# Patient Record
Sex: Male | Born: 1965 | Race: White | Hispanic: No
Health system: Southern US, Community
[De-identification: ages and names within clinical notes are randomized; demographics above are authoritative.]

---

## 2006-02-02 ENCOUNTER — Ambulatory Visit (HOSPITAL_COMMUNITY): Admission: RE | Admit: 2006-02-02 | Discharge: 2006-02-02 | Payer: Self-pay | Admitting: Family Medicine

## 2006-03-07 ENCOUNTER — Encounter: Admission: RE | Admit: 2006-03-07 | Discharge: 2006-03-07 | Payer: Self-pay | Admitting: Orthopaedic Surgery

## 2006-03-13 ENCOUNTER — Encounter: Admission: RE | Admit: 2006-03-13 | Discharge: 2006-03-13 | Payer: Self-pay | Admitting: Orthopaedic Surgery

## 2006-03-15 ENCOUNTER — Inpatient Hospital Stay (HOSPITAL_COMMUNITY): Admission: RE | Admit: 2006-03-15 | Discharge: 2006-03-19 | Payer: Self-pay | Admitting: Orthopaedic Surgery

## 2006-03-27 ENCOUNTER — Encounter: Payer: Self-pay | Admitting: Vascular Surgery

## 2006-03-27 ENCOUNTER — Ambulatory Visit (HOSPITAL_COMMUNITY): Admission: RE | Admit: 2006-03-27 | Discharge: 2006-03-27 | Payer: Self-pay | Admitting: Orthopaedic Surgery

## 2006-07-31 ENCOUNTER — Encounter: Admission: RE | Admit: 2006-07-31 | Discharge: 2006-07-31 | Payer: Self-pay | Admitting: Orthopaedic Surgery

## 2006-09-05 ENCOUNTER — Ambulatory Visit (HOSPITAL_COMMUNITY): Admission: RE | Admit: 2006-09-05 | Discharge: 2006-09-05 | Payer: Self-pay | Admitting: Neurosurgery

## 2007-04-16 ENCOUNTER — Ambulatory Visit (HOSPITAL_COMMUNITY): Admission: RE | Admit: 2007-04-16 | Discharge: 2007-04-16 | Payer: Self-pay | Admitting: Family Medicine

## 2007-12-18 ENCOUNTER — Ambulatory Visit (HOSPITAL_COMMUNITY): Admission: RE | Admit: 2007-12-18 | Discharge: 2007-12-18 | Payer: Self-pay | Admitting: Family Medicine

## 2008-09-10 ENCOUNTER — Ambulatory Visit (HOSPITAL_COMMUNITY): Admission: RE | Admit: 2008-09-10 | Discharge: 2008-09-10 | Payer: Self-pay | Admitting: Anesthesiology

## 2010-07-17 ENCOUNTER — Encounter: Payer: Self-pay | Admitting: Family Medicine

## 2010-11-11 NOTE — Op Note (Signed)
[name], VEGH NO.:  [id number]   MEDICAL RECORD NO.:  [id number]          PATIENT TYPE:  OIB   LOCATION:  5021                         FACILITY:  [facility]   PHYSICIAN:  Sharolyn Douglas, M.D.        DATE OF BIRTH:  1965/07/10   DATE OF PROCEDURE:  03/14/2006  DATE OF DISCHARGE:                                 OPERATIVE REPORT   DIAGNOSES:  1. 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  2. L4-5 degenerative disk disease with posterior annular tear and      retrolisthesis.   PROCEDURES:  1. L4-5 and L5-S1 hemilaminectomy with wide decompression of the left L5      and S1 nerve roots.  2. Transforaminal lumbar interbody fusion, L4-5 and L5-S1, with placement      of two PEEK cages packed with bone morphogenic protein.  3. Segmental pedicle screw instrumentation L4 through S1 using the Abbott      Spine system.  4. Posterior spinal arthrodesis, L4 through S1.  5. Local autogenous bone graft supplemented with 15 cc of Grafton      allograft and bone morphogenic protein.   SURGEON:  Sharolyn Douglas, M.D.   ASSISTANT:  Verlin Fester, P.A.   ANESTHESIA:  General endotracheal.   ESTIMATED BLOOD LOSS:  400 mL, 200 returned from Cell Saver.   COMPLICATIONS:  None.   Needle and sponge count correct.   INDICATIONS:  The patient is a 45 year old pleasant male with severe,  intractable left lower extremity pain and weakness.  He has an L5-S1 isthmic  spondylolisthesis with an underlying foraminal disk herniation and severe  left L5 nerve root impingement with secondary radiculopathy.  There is a  retrolisthesis of L4 and L5 with associated posterior annular tear and disk  bulging with lateral recess narrowing.  He has failed to respond to a  selected nerve root block and facet injection.  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  He is taking escalating doses of narcotics and  neuroleptics without relief.  At this  point, he elects to undergo two-level  lumbar decompression and fusion from L4 to S1 in hopes of improving his  symptoms.  Risks and benefits were reviewed.   PROCEDURE:  The patient was identified in the holding area.  After informed  consent, he was taken to the operating room.  He was given prophylactic IV  antibiotics.  Neuromonitoring was established in the form of SSEPs and lower  extremity EMGs.  He underwent general endotracheal anesthesia and was turned  prone onto the Wilson frame.  All bony prominences padded, face and eyes  protected at all times, back prepped and draped in the usual sterile  fashion.  A midline incision was made from L4 down to S1.  Dissection was  carried sharply through the deep fascia.  Subperiosteal exposure was carried  out to the tips of the transverse processes of L4, L5, and the sacral ala  bilaterally.  Intraoperative x-ray was taken to confirm the levels.  The  L5  spinous process was easily identifiable secondary to the pars fractures.  The protractors were placed.   We then turned our attention to performing a wide laminectomy at L5-S1 by  removing the entire Gill fragment.  Further decompression was then carried  out laterally on the left side.  We found a large osteophyte and fibrous  material projecting through the pars defect directly into the L5 nerve root.  This also appeared to be impinging somewhat on the S1 nerve root.  All of  this material was decompressed radically of the foramen.  We found that the  L5-S1 foramen was stenotic not only from the osteophytes but also due to the  L5 pedicle compressing the nerve root against the back of the sacrum.  A  wide foraminotomy was completed.  The S1 nerve root was decompressed down  towards the S1 pedicle.  We then carried the decompression up proximally,  completing a hemilaminectomy of L4-5.  This allowed identification of the  takeoff of the L5 nerve root from the thecal sac.  The L4-5 facet  joint was  overgrown and there was a bulging disk which was narrowing the lateral  recess.  Diskectomy was completed at this level, allowing full decompressing  of the nerve root.  At this point we felt that both the L5 and S1 nerve  roots were thoroughly decompressed except within the L5-S1 foramen due to  the up-down stenosis and this would be dealt with, with a transforaminal  lumbar interbody fusion procedure for indirect distraction of the foramen  and further decompression of the nerve root.   We then turned our attention to placing pedicle screws at L4, L5, and S1  bilaterally.  Using an anatomic probing technique, each pedicle starting  point was identified and initiated with the awl.  The pedicle probe was then  used to cannulate the pedicles.  Each hole was palpated with the ball-tip  feeler and there were no breaches.  The pedicles were tapped.  We placed 6.5  x 55 mm screws at L4 bilaterally, 6.5 x 50 mm screws at L5 bilaterally, and  7.5 x 45 mm screws bilaterally in the sacrum.  The screw purchase was good.  We stimulated each pedicle screw using trigger EMGs and there were no  deleterious changes.   We then turned our attention to completing transforaminal lumbar interbody  fusion procedures at both L4-5 and L5-S1.  The remaining facet joints were  osteotomized, creating a transforaminal window.  Throughout the procedure,  free-running EMGs were monitored.  The exiting and transversing nerve roots  were identified at each level.  Starting at L5-S1, the disk space was  entered.  The overhanging osteophytes on the sacrum were removed using the  Kerrison punch.  Various shavers and interbody distractors were used to  prepare the disk space along with curved curettes.  A radical diskectomy was  completed.  The cartilaginous endplates were scraped across to the  contralateral side.  Disk space was dilated up to 11 mm.  The disk space was packed with BMP sponges and local bone  graft obtained from laminectomy.  We  then inserted the 11-mm PEEK cage which had been packed with a BMP sponge  into the interspace, tamped it anteriorly and across the midline.  We had  good distraction.  We now re-evaluated the L5-S1 foramen and the nerve root  was completely free.  We then performed a similar procedure at L4-5.  This  time, a  13-mm PEEK cage was used.  Again, the disk space was packed with the  BMP sponges along with local bone graft.  The 13-mm PEEK cage was inserted  into the interspace and again tamped anteriorly and across the midline.  We  had excellent distraction at this level as well.  Hemostasis was achieved.   We then completed the posterior spinal fusion by decorticating the  transverse process of L4-L5 and the sacral ala bilaterally and tightly  packing the lateral gutters with the remaining local bone graft which was  supplemented with 15 cc of Grafton allograft.  Then 70-mm titanium rods were  placed into the polyaxial screw heads and gentle compression was applied  across each segment before shearing off the locking caps.  A cross connector  was placed.   The wound was irrigated.  Gelfoam was left over the exposed epidural space.  Intraoperative x-ray showed appropriate positioning of the instrumentation  with complete reduction of the spondylolisthesis and good distraction of the  disk spaces.  A deep Hemovac drain was left in place.  On-Q pain catheters  were placed percutaneously into the paraspinal muscles and bolused with 5 cc  of 0.5% Marcaine.  The deep fascia was closed with a running #1 Vicryl  suture, subcutaneous layer closed with 0 Vicryl and 2-0 Vicryl suture  followed by a running 3-0 subcuticular Vicryl suture on the skin edges.  Benzoin Steri-Strips were placed.  Sterile dressing was applied.  Patient  was turned supine, extubated without difficulty, and transferred to recovery  in stable condition, able to move his upper and lower  extremities.   It should be noted that my assistant, [employer], P.A. was present  throughout the procedure including during the positioning, the exposure, the  decompression, the instrumentation, the arthrodesis, and she also assisted  with the wound closure.      Sharolyn Douglas, M.D.  Electronically Signed     MC/MEDQ  D:  03/14/2006  T:  03/15/2006  Job:  [id number]

## 2010-11-11 NOTE — Discharge Summary (Signed)
[name], Jack Williams NO.:  [id number]   MEDICAL RECORD NO.:  [id number]          PATIENT TYPE:  INP   LOCATION:  5021                         FACILITY:  [facility]   PHYSICIAN:  Sharolyn Douglas, M.D.        DATE OF BIRTH:  [birth year]   DATE OF ADMISSION:  03/14/2006  DATE OF DISCHARGE:  03/19/2006                                 DISCHARGE SUMMARY   ADMISSION DIAGNOSES:  1. L4-5 and L5-S1 spondylosis and L5-S1 spondylolisthesis.  2. Reflux.   DISCHARGE DIAGNOSES:  1. Status post L4 to S1 posterior spinal fusion, doing well.  2. Postoperative blood loss anemia.   PROCEDURE:  On March 14, 2006, the patient was taken to the operating  room for an L4 to S1 posterior spinal fusion with pedicle screws, Gill  laminectomy, and TLIF.  Surgeon was Sharolyn Douglas, assistant [employer], and  anesthesia use was general.   CONSULTATIONS:  None.   LABORATORY DATA:  Preoperatively, CBC with diff was within normal limits  with the exception of a white count of 13.1, neutrophils of 81, absolute  neutrophils of 10.6, lymphs of 11, and absolute monos of 1.  Postoperatively, the H and H were monitored x3 days and he had a low of 12.2  and 35.6, on March 15, 2006, and increased up gradually thereafter.  PT/INR and PTT, preop, were normal.  Complete metabolic panel, preop, was  normal with the exception of glucose of 103, AST of 66, ALT of 268.  Basic  metabolic panel x2 days postoperatively.  On postoperative day #1, on  March 15, 2006, CO2 was 33, glucose of 109.  On March 16, 2006,  glucose was 114, otherwise normal.  UA from March 14, 2006 was negative.  Blood typing, from March 14, 2006, was type A positive.  Antibody screen  was negative.   X-rays of the lumbar spine done on March 14, 2006 showed L4 to S1  posterior fusion without complicating features.  CT scan, from March 17, 2006, of the lumbar spine showed expected postoperative changes from L4  to  S1 with pedicle screws well positioned.  No abnormal features.  X-rays from  March 19, 2006 show still in alignment intact hardware from L4 to S1.  No EKG is seen on chart.   BRIEF HISTORY:  The patient is a 45 year old male who has had severe pain in  his back and his left lower extremity for several months.  He has increasing  pain and increased dependence on narcotics.  He has been unable to work.  Due to his failure to improve with conservative treatments and his x-ray and  MRI findings of spondylosis as well as spondylolisthesis, it was thought his  best course of management would be L4 to S1 posterior spinal fusion  procedure and decompression.  Risks and benefits of the procedure were  discussed with the patient by Dr. Noel Gerold and he indicated understanding and  opted to proceed.   HOSPITAL COURSE:  On March 14, 2006, the patient was take to the  operating room for the above-listed procedure.  He tolerated the procedure  well without any intraoperative complications and he was transferred to the  recovery room in stable condition.  Intraoperatively, blood loss was 500 mL  with 200 mL returned via CellSaver.   Postoperatively, routine orthopedic spine protocol was followed and,  initially, he progressed along very well.  On postoperative day #1, he was  doing quite well.  His preoperative left lower extremity pain was resolved  and he was very pleased.  Unfortunately, over that night, the first night,  he did develop increasing pain in his right lower extremity.  He was put on  Toradol and he continued with his pain medications.  Unfortunately, the pain  continued to increase.  Because of his increasing pain in the right lower  extremity, CT scan was ordered of the lumbar spine to confirm good placement  within the pedicles.  As previously dictated, a CT scan did confirm that all  pedicle screws were well positioned within the pedicles and no evidence or  anything to  suggest that they were irritating a nerve on the right.  On  March 19, 2006, we began giving the patient different pain medications,  oxycodone immediate release 15 mg as well as his Percocet for pain.  His  pain control did improve significantly.  By the end of that day, the patient  had called [place] at the office to request to be discharged.  His pain control  was adequate.  He was medically stable and ready for discharge, both  orthopedically and medically.   PLAN:  The patient is a 45 year old male status post L4 to S1 posterior  spinal fusion.   ACTIVITY:  Daily ambulation program.  Brace on when he is up.  No lifting  over 5 pounds.  Back precautions at all times.  Dressing changes daily.  Keep incision dry until drainage stops.   FOLLOWUP:  2 weeks postoperatively with Dr. Noel Gerold.   DIET:  Regular home diet as tolerated.   MEDICAL DECISION MAKING:  Oxycodone 15 mg immediate release as needed for  pain and Percocet as needed for breakthrough pain.  Robaxin for muscle  spasm.  Multivitamin daily.  Calcium daily.  Colace twice daily as needed.  Laxative as needed.   CONDITION ON DISCHARGE:  Stable, improved.   DISPOSITION:  The patient will be discharged to his home with home-health  physical therapy, occupational therapy, and his family's assistance.      Verlin Fester, P.A.      Sharolyn Douglas, M.D.  Electronically Signed    CM/MEDQ  D:  05/02/2006  T:  05/03/2006  Job:  [id number]

## 2011-10-25 ENCOUNTER — Other Ambulatory Visit: Payer: Self-pay | Admitting: Anesthesiology

## 2011-10-25 DIAGNOSIS — M545 Low back pain, unspecified: Secondary | ICD-10-CM

## 2011-10-27 ENCOUNTER — Ambulatory Visit
Admission: RE | Admit: 2011-10-27 | Discharge: 2011-10-27 | Disposition: A | Discharge status: Home / Self Care | Payer: Self-pay | Source: Ambulatory Visit | Attending: Anesthesiology | Admitting: Anesthesiology

## 2011-10-27 DIAGNOSIS — M545 Low back pain, unspecified: Secondary | ICD-10-CM

## 2013-06-12 ENCOUNTER — Other Ambulatory Visit: Payer: Self-pay | Admitting: Orthopaedic Surgery

## 2013-06-12 DIAGNOSIS — M546 Pain in thoracic spine: Secondary | ICD-10-CM

## 2013-06-13 ENCOUNTER — Ambulatory Visit
Admission: RE | Admit: 2013-06-13 | Discharge: 2013-06-13 | Disposition: A | Discharge status: Home / Self Care | Source: Ambulatory Visit | Attending: Orthopaedic Surgery | Admitting: Orthopaedic Surgery

## 2013-06-13 DIAGNOSIS — M546 Pain in thoracic spine: Secondary | ICD-10-CM

## 2013-06-16 ENCOUNTER — Other Ambulatory Visit: Payer: Self-pay

## 2015-05-05 ENCOUNTER — Ambulatory Visit (HOSPITAL_COMMUNITY): Attending: Orthopaedic Surgery | Admitting: Physical Therapy

## 2015-05-05 DIAGNOSIS — M5441 Lumbago with sciatica, right side: Secondary | ICD-10-CM | POA: Insufficient documentation
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
## 2015-10-11 ENCOUNTER — Encounter (HOSPITAL_COMMUNITY): Payer: Self-pay

## 2015-10-26 DIAGNOSIS — J209 Acute bronchitis, unspecified: Secondary | ICD-10-CM | POA: Diagnosis not present

## 2015-11-03 DIAGNOSIS — H9203 Otalgia, bilateral: Secondary | ICD-10-CM | POA: Diagnosis not present

## 2015-11-03 DIAGNOSIS — Z6822 Body mass index (BMI) 22.0-22.9, adult: Secondary | ICD-10-CM | POA: Diagnosis not present

## 2015-11-03 DIAGNOSIS — J209 Acute bronchitis, unspecified: Secondary | ICD-10-CM | POA: Diagnosis not present

## 2015-11-03 DIAGNOSIS — J040 Acute laryngitis: Secondary | ICD-10-CM | POA: Diagnosis not present

## 2015-11-03 DIAGNOSIS — Z1389 Encounter for screening for other disorder: Secondary | ICD-10-CM | POA: Diagnosis not present

## 2015-11-03 DIAGNOSIS — J329 Chronic sinusitis, unspecified: Secondary | ICD-10-CM | POA: Diagnosis not present

## 2016-12-29 DIAGNOSIS — J329 Chronic sinusitis, unspecified: Secondary | ICD-10-CM | POA: Diagnosis not present

## 2016-12-29 DIAGNOSIS — Z0000 Encounter for general adult medical examination without abnormal findings: Secondary | ICD-10-CM | POA: Diagnosis not present

## 2016-12-29 DIAGNOSIS — Z6822 Body mass index (BMI) 22.0-22.9, adult: Secondary | ICD-10-CM | POA: Diagnosis not present

## 2016-12-29 DIAGNOSIS — Z1389 Encounter for screening for other disorder: Secondary | ICD-10-CM | POA: Diagnosis not present

## 2016-12-29 DIAGNOSIS — J302 Other seasonal allergic rhinitis: Secondary | ICD-10-CM | POA: Diagnosis not present

## 2017-01-12 DIAGNOSIS — Z1211 Encounter for screening for malignant neoplasm of colon: Secondary | ICD-10-CM | POA: Diagnosis not present

## 2017-02-27 DIAGNOSIS — J309 Allergic rhinitis, unspecified: Secondary | ICD-10-CM | POA: Diagnosis not present

## 2017-02-27 DIAGNOSIS — H9313 Tinnitus, bilateral: Secondary | ICD-10-CM | POA: Diagnosis not present

## 2018-05-06 ENCOUNTER — Other Ambulatory Visit: Payer: Self-pay | Admitting: Orthopedic Surgery

## 2018-05-06 DIAGNOSIS — M4716 Other spondylosis with myelopathy, lumbar region: Secondary | ICD-10-CM

## 2018-05-07 ENCOUNTER — Telehealth: Payer: Self-pay | Admitting: Nurse Practitioner

## 2018-05-07 NOTE — Telephone Encounter (Signed)
Phone call to patient to verify medication list and allergies for myelogram procedure. Pt aware he will not need to hold any medications for procedure.  

## 2018-05-15 ENCOUNTER — Other Ambulatory Visit: Payer: Self-pay | Admitting: Orthopedic Surgery

## 2018-05-15 DIAGNOSIS — M4716 Other spondylosis with myelopathy, lumbar region: Secondary | ICD-10-CM

## 2018-05-15 DIAGNOSIS — M4325 Fusion of spine, thoracolumbar region: Secondary | ICD-10-CM

## 2018-05-16 ENCOUNTER — Ambulatory Visit
Admission: RE | Admit: 2018-05-16 | Discharge: 2018-05-16 | Disposition: A | Discharge status: Home / Self Care | Source: Ambulatory Visit | Attending: Orthopedic Surgery | Admitting: Orthopedic Surgery

## 2018-05-16 DIAGNOSIS — M4325 Fusion of spine, thoracolumbar region: Secondary | ICD-10-CM

## 2018-05-16 DIAGNOSIS — M4716 Other spondylosis with myelopathy, lumbar region: Secondary | ICD-10-CM

## 2018-05-16 MED ORDER — DIAZEPAM 5 MG PO TABS
10.0000 mg | ORAL_TABLET | Freq: Once | ORAL | Status: AC
  Administered 2018-05-16: 5 mg via ORAL

## 2018-05-16 MED ORDER — ONDANSETRON HCL 4 MG/2ML IJ SOLN: 4.0000 mg | Freq: Four times a day (QID) | INTRAMUSCULAR | Status: DC | PRN

## 2018-05-16 MED ORDER — IOPAMIDOL (ISOVUE-M 300) INJECTION 61%
10.0000 mL | Freq: Once | INTRAMUSCULAR | Status: AC | PRN
  Administered 2018-05-16: 10 mL via INTRATHECAL

## 2018-05-16 NOTE — Discharge Instructions (Signed)

## 2019-04-16 ENCOUNTER — Other Ambulatory Visit: Payer: Self-pay | Admitting: Orthopaedic Surgery

## 2019-04-16 DIAGNOSIS — M4325 Fusion of spine, thoracolumbar region: Secondary | ICD-10-CM

## 2019-04-16 DIAGNOSIS — M5126 Other intervertebral disc displacement, lumbar region: Secondary | ICD-10-CM

## 2019-04-22 ENCOUNTER — Ambulatory Visit
Admission: RE | Admit: 2019-04-22 | Discharge: 2019-04-22 | Disposition: A | Discharge status: Home / Self Care | Source: Ambulatory Visit | Attending: Orthopaedic Surgery | Admitting: Orthopaedic Surgery

## 2019-04-22 ENCOUNTER — Other Ambulatory Visit: Payer: Self-pay

## 2019-04-22 DIAGNOSIS — M4325 Fusion of spine, thoracolumbar region: Secondary | ICD-10-CM

## 2019-04-22 DIAGNOSIS — M5126 Other intervertebral disc displacement, lumbar region: Secondary | ICD-10-CM

## 2020-10-31 IMAGING — CR DG MYELOGRAPHY LUMBAR INJ MULTI REGION
12 of 24 series · 12 of 24 positions shown · non-contrast
Comparison: Noncontrast thoracic and lumbar spine CT 06/13/2013.
Lumbar spine MRI 08/01/2012.

Addendum:
CLINICAL DATA: Low back pain radiating into the left buttock and
thigh including across the anterior left knee. Prior thoracolumbar
fusion.
TECHNIQUE: Contiguous axial images were obtained through the Thoracic and
Lumbar spine after the intrathecal infusion of infusion. Coronal and
sagittal reconstructions were obtained of the axial image sets.

[w lumbar spine lat]
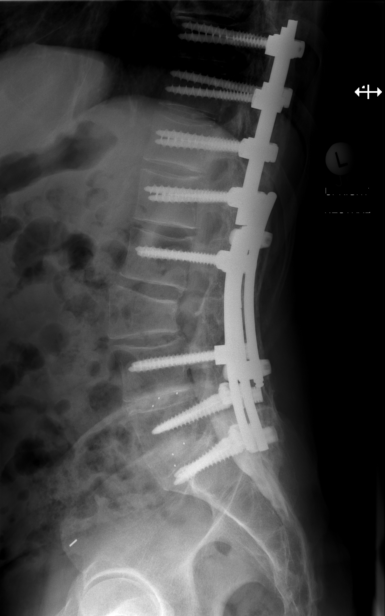

[w lumbar spine flexion]
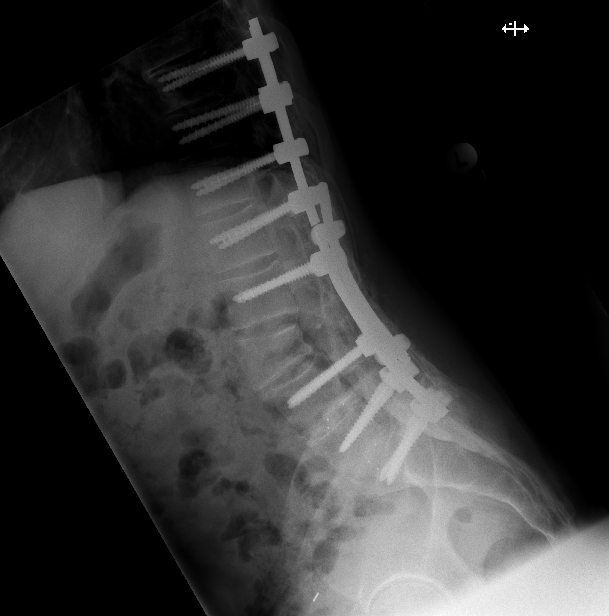

[vasc standard (1 of 10)]
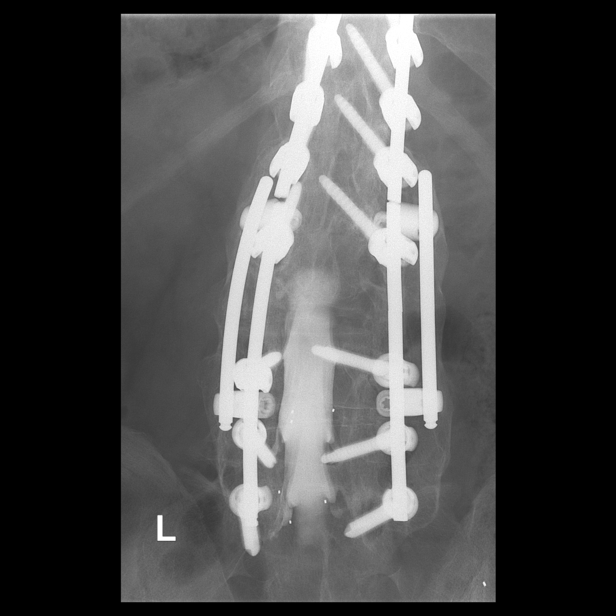

[vasc standard (2 of 10)]
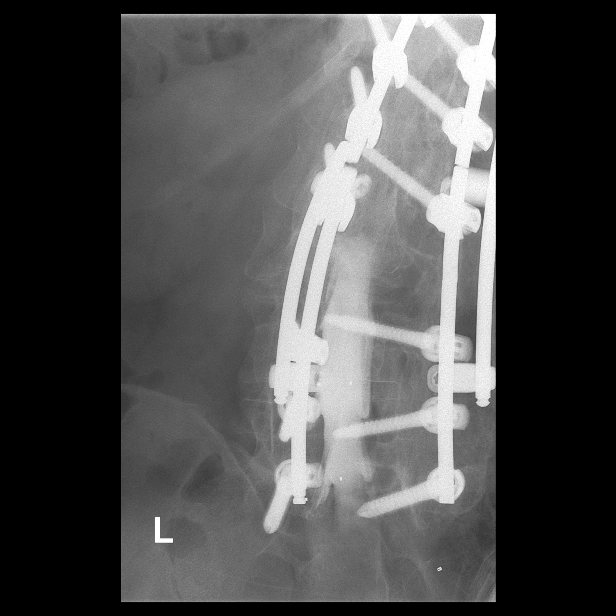

[vasc standard (3 of 10)]
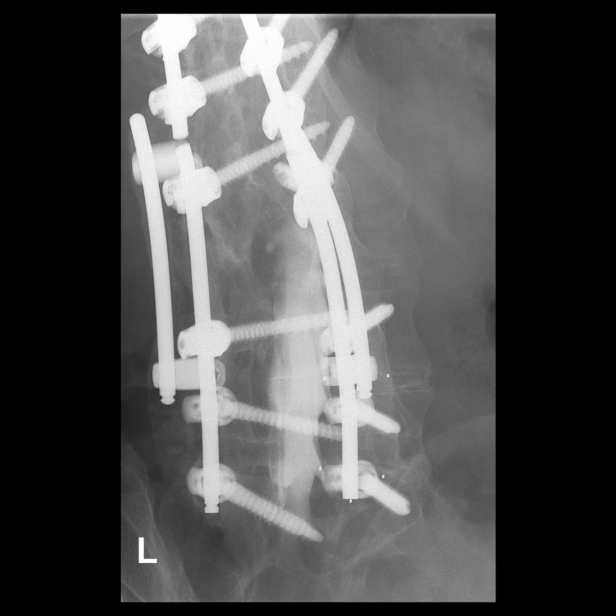

[vasc standard (4 of 10)]
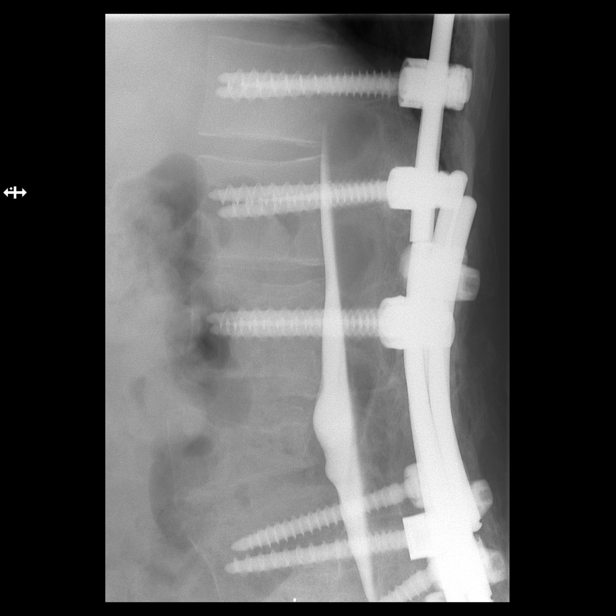

[vasc standard (5 of 10)]
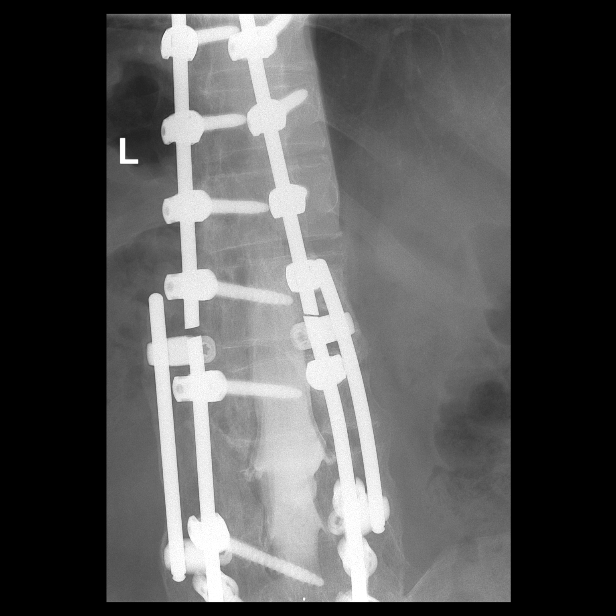

[vasc standard (6 of 10)]
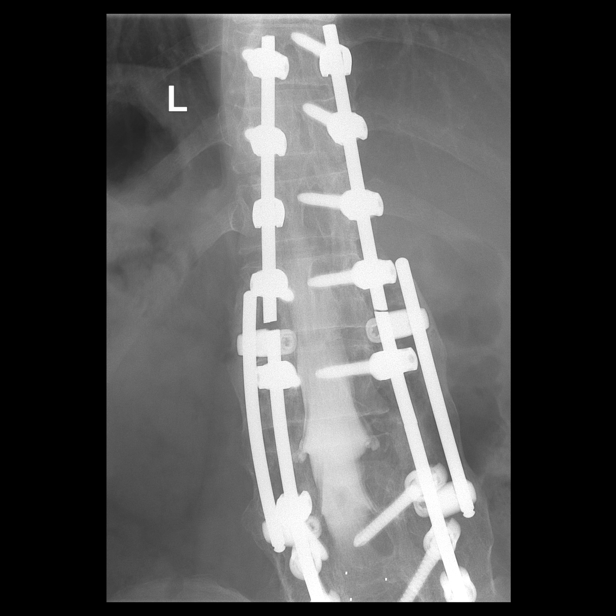

[vasc standard (7 of 10)]
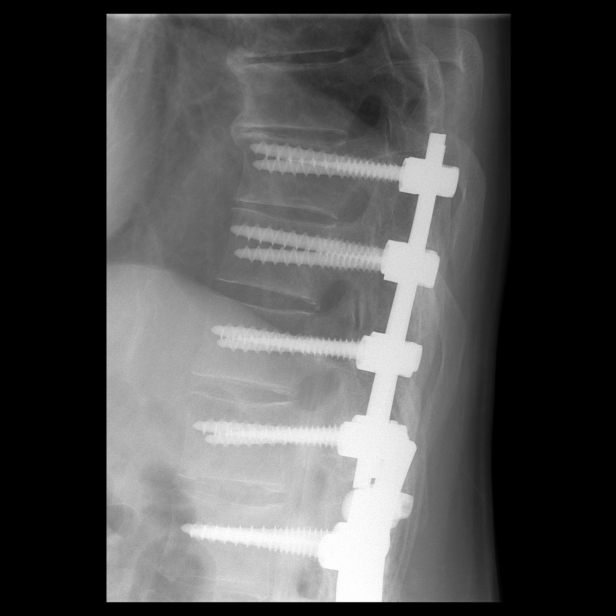

[vasc standard (8 of 10)]
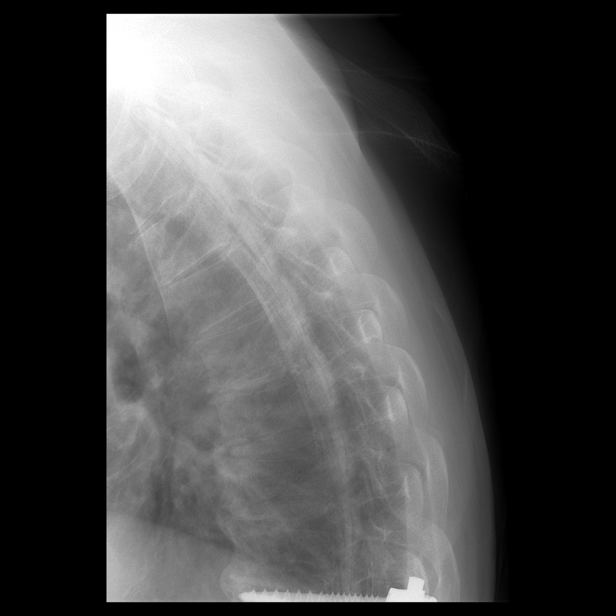

[vasc standard (9 of 10)]
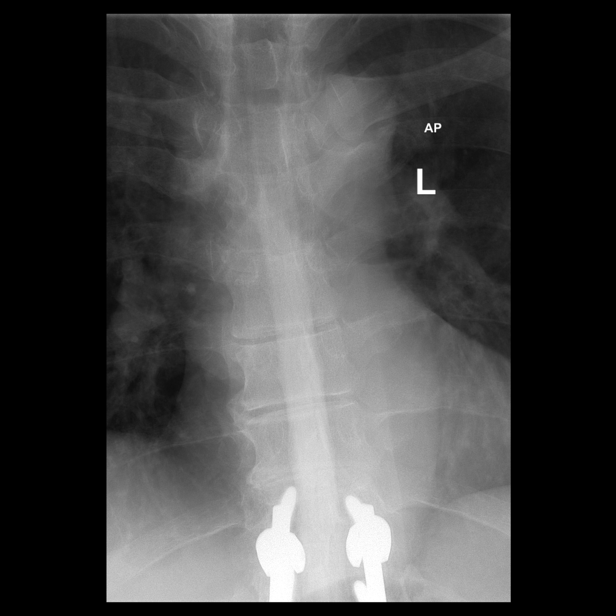

[vasc standard (10 of 10)]
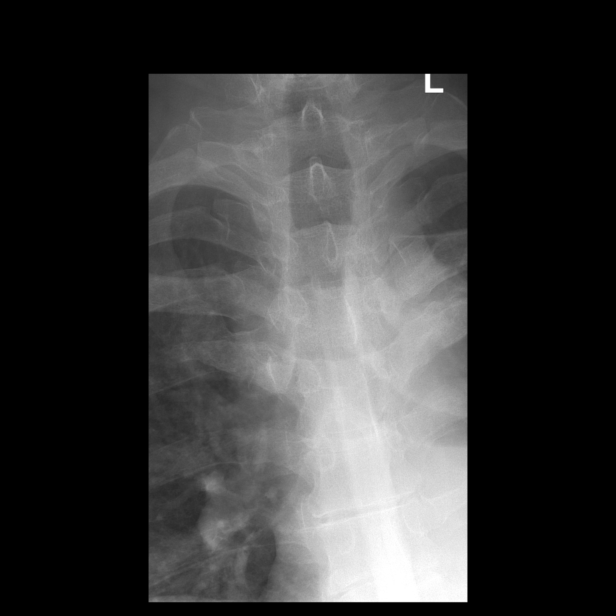

[12 of 24 positions shown; findings below may reference images not displayed]

FLUOROSCOPY TIME:  Radiation Exposure Index (as provided by the
fluoroscopic device): 406.12 microGray*m^2

Fluoroscopy Time (in minutes and seconds):  41 seconds

PROCEDURE:
LUMBAR PUNCTURE FOR THORACIC AND LUMBAR MYELOGRAM

After thorough discussion of risks and benefits of the procedure
including bleeding, infection, injury to nerves, blood vessels,
adjacent structures as well as headache and CSF leak, written and
oral informed consent was obtained. Consent was obtained by Dr.
Enigert Duca.

Patient was positioned prone on the fluoroscopy table. Local
anesthesia was provided with 1% lidocaine without epinephrine after
prepped and draped in the usual sterile fashion. Puncture was
performed at L3 using a 3 1/2 inch 22-gauge spinal needle via a
midline approach. Using a single pass through the dura, the needle
was placed within the thecal sac, with return of clear CSF. 10 mL of
Isovue Y-0CC was injected into the thecal sac, with normal
opacification of the nerve roots and cauda equina consistent with
free flow within the subarachnoid space. The patient was then moved
to the trendelenburg position and contrast flowed into the Thoracic
spine region.

I personally performed the lumbar puncture and administered the
intrathecal contrast. I also personally supervised acquisition of
the myelogram images.
FINDINGS: THORACIC AND LUMBAR MYELOGRAM FINDINGS:

Sequelae of T10-S1 posterior fusion are again identified with
pedicle screws in place bilaterally at each level except for L3.
There has been interval revision of posterior spinal instrumentation
since the prior CT with placement of additional more laterally
located rods from L2-L4. The medial rods are fractured bilaterally
at L1 with slightly more distraction on the left than the right. The
right-sided medial rod is also fractured at L3 without distraction.
There is chronic posterior L3 vertebral body height loss. Slight
retrolisthesis of L2 on L3 and slight anterolisthesis of L3 on L4 do
not change with flexion or extension. There is slight thoracic
scoliosis without evidence of significant listhesis. No significant
thoracic or lumbar spinal stenosis is evident.

CT THORACIC MYELOGRAM FINDINGS:

There is mild thoracic dextroscoliosis without listhesis. No
fracture or destructive osseous process is identified. Posterior
thoracolumbar fusion is noted with pedicle screws and
interconnecting rods bilaterally at T10, T11, and T12 extending into
the lumbar spine. There is no evidence of pedicle screw loosening.
The tips of the left T10 and bilateral T11 screws extend slightly
anterior to the vertebral bodies. Solid posterolateral osseous
fusion masses are present bilaterally from T10 to L1.

There is a prominent, tortuous vessel along the dorsal surface of
the cord in the mid and lower thoracic spine. The spinal cord is
normal in caliber.

A broad-based posterior disc osteophyte complex at C6-7 results in
mild spinal stenosis without spinal cord mass effect or neural
foraminal stenosis. There is a shallow left paracentral disc
protrusion at T5-6 without stenosis or spinal cord mass effect.

CT LUMBAR MYELOGRAM FINDINGS:

There is chronic trace retrolisthesis of L2 on L3 and trace
anterolisthesis of L3 on L4. Posterior fusion extends from the
thoracic spine inferiorly to S1. Pedicle screws are present
bilaterally from L1-S1 with the exception of L3. There is no
evidence of screw loosening. There are fractures of the
interconnecting rods as described above. Solid posterolateral
osseous fusion masses are present bilaterally from the lower
thoracic spine to S1. There is also solid interbody osseous fusion
at L4-5 and L5-S1.

There is chronic posterior wedging of the L3 vertebral body with
prior resection of the posterior elements including pedicles. No
acute vertebral fracture or destructive osseous process is
identified. Postsurgical changes are noted in the posterior right
ilium. The conus medullaris terminates at L1-2. The paraspinal soft
tissues are unremarkable aside from postoperative changes.

The spinal canal is widely patent throughout the lumbar spine. No
compressive neural foraminal stenosis is identified with only mild
osseous neural foraminal narrowing noted on the left at L5-S1.
IMPRESSION: 1. Solid T10-S1 posterior fusion.  Prior L3 osteotomy.
2. Fracture of the medial interconnecting rods bilaterally in the
lumbar spine.
3. No significant stenosis in the thoracic or lumbar spine.

ADDENDUM:
Prominent intradural vessel along the dorsal spinal cord in the mid
and lower thoracic spine. Consider thoracic spine MRI (preferably
without and with contrast) to further evaluate for arteriovenous
fistula, particularly if the patient has signs of myelopathy.

*** End of Addendum ***
